# Patient Record
Sex: Male | Born: 1990 | Race: White | Hispanic: No | Marital: Married | Smoking: Never smoker
Health system: Southern US, Community
[De-identification: ages and names within clinical notes are randomized; demographics above are authoritative.]

---

## 2021-01-19 ENCOUNTER — Emergency Department: Payer: BC Managed Care – PPO

## 2021-01-19 ENCOUNTER — Encounter: Payer: Self-pay | Admitting: Emergency Medicine

## 2021-01-19 ENCOUNTER — Emergency Department
Admission: EM | Admit: 2021-01-19 | Discharge: 2021-01-19 | Disposition: A | Discharge status: Home / Self Care | Payer: BC Managed Care – PPO | Attending: Emergency Medicine | Admitting: Emergency Medicine

## 2021-01-19 ENCOUNTER — Ambulatory Visit
Admission: EM | Admit: 2021-01-19 | Discharge: 2021-01-19 | Disposition: A | Discharge status: Home / Self Care | Payer: BC Managed Care – PPO | Attending: Sports Medicine | Admitting: Sports Medicine

## 2021-01-19 ENCOUNTER — Other Ambulatory Visit: Payer: Self-pay

## 2021-01-19 DIAGNOSIS — Z20822 Contact with and (suspected) exposure to covid-19: Secondary | ICD-10-CM | POA: Diagnosis not present

## 2021-01-19 DIAGNOSIS — E0590 Thyrotoxicosis, unspecified without thyrotoxic crisis or storm: Secondary | ICD-10-CM

## 2021-01-19 DIAGNOSIS — R634 Abnormal weight loss: Secondary | ICD-10-CM

## 2021-01-19 DIAGNOSIS — I472 Ventricular tachycardia: Secondary | ICD-10-CM | POA: Diagnosis not present

## 2021-01-19 DIAGNOSIS — R000 Tachycardia, unspecified: Secondary | ICD-10-CM

## 2021-01-19 DIAGNOSIS — I499 Cardiac arrhythmia, unspecified: Secondary | ICD-10-CM

## 2021-01-19 DIAGNOSIS — R0602 Shortness of breath: Secondary | ICD-10-CM

## 2021-01-19 LAB — CBC
HCT: 40.7 % (ref 39.0–52.0)
Hemoglobin: 13.9 g/dL (ref 13.0–17.0)
MCH: 29 pg (ref 26.0–34.0)
MCHC: 34.2 g/dL (ref 30.0–36.0)
MCV: 84.8 fL (ref 80.0–100.0)
Platelets: 307 10*3/uL (ref 150–400)
RBC: 4.8 MIL/uL (ref 4.22–5.81)
RDW: 12.7 % (ref 11.5–15.5)
WBC: 4.5 10*3/uL (ref 4.0–10.5)
nRBC: 0 % (ref 0.0–0.2)

## 2021-01-19 LAB — BRAIN NATRIURETIC PEPTIDE: B Natriuretic Peptide: 11.2 pg/mL (ref 0.0–100.0)

## 2021-01-19 LAB — BASIC METABOLIC PANEL
Anion gap: 8 (ref 5–15)
BUN: 18 mg/dL (ref 6–20)
CO2: 27 mmol/L (ref 22–32)
Calcium: 9.7 mg/dL (ref 8.9–10.3)
Chloride: 102 mmol/L (ref 98–111)
Creatinine, Ser: 0.72 mg/dL (ref 0.61–1.24)
GFR, Estimated: >60 mL/min (ref >60)
Glucose, Bld: 110 mg/dL — ABNORMAL HIGH (ref 70–99)
Potassium: 4.3 mmol/L (ref 3.5–5.1)
Sodium: 137 mmol/L (ref 135–145)

## 2021-01-19 LAB — RESP PANEL BY RT-PCR (FLU A&B, COVID) ARPGX2
Influenza A by PCR: NEGATIVE
Influenza B by PCR: NEGATIVE
SARS Coronavirus 2 by RT PCR: NEGATIVE

## 2021-01-19 LAB — TSH: TSH: <0.01 u[IU]/mL — ABNORMAL LOW (ref 0.350–4.500)

## 2021-01-19 LAB — T4, FREE: Free T4: 4.76 ng/dL — ABNORMAL HIGH (ref 0.61–1.12)

## 2021-01-19 LAB — TROPONIN I (HIGH SENSITIVITY): Troponin I (High Sensitivity): 4 ng/L (ref <18)

## 2021-01-19 MED ORDER — PROPRANOLOL HCL 10 MG PO TABS: 10.0000 mg | ORAL_TABLET | Freq: Three times a day (TID) | ORAL | 0 refills | Status: AC

## 2021-01-19 MED ORDER — SODIUM CHLORIDE 0.9 % IV BOLUS
1000.0000 mL | Freq: Once | INTRAVENOUS | Status: AC
  Administered 2021-01-19: 1000 mL via INTRAVENOUS

## 2021-01-19 MED ORDER — IOHEXOL 350 MG/ML SOLN
75.0000 mL | Freq: Once | INTRAVENOUS | Status: AC | PRN
  Administered 2021-01-19: 75 mL via INTRAVENOUS

## 2021-01-19 NOTE — ED Triage Notes (Signed)
Patient c/o elevated heart rate that started about 1 week ago. He states over the last few days he has been having shortness of breath.

## 2021-01-19 NOTE — ED Triage Notes (Addendum)
Pt via EMS from [facility]. [facility] sent him over here for ST. Per note, pt HR in the 140. Pt also states he is having some SOB. Denies CP but does feel some palpitations in his chest and that's why he went to [facility]. Pt is A&Ox4 and NAD.

## 2021-01-19 NOTE — ED Triage Notes (Signed)
Pt comes into the ED via EMS from [facility] with c/o tachycardia for the past week up into the 140's with SOB  120-145HR 99%RA 128/65 98.3temp #[id number]

## 2021-01-19 NOTE — ED Provider Notes (Addendum)
[facility]    CSN: [id number] Arrival date & time: 01/19/21  1440      History   Chief Complaint Chief Complaint  Patient presents with  . Irregular Heart Beat  . Shortness of Breath    HPI Brett Huang is a 30 y.o. male.   Pleasant 30 year old male who presents for evaluation of racing heart and shortness of breath.  On further history it appears as though he has had a symptoms now for about 2 weeks.  He began while he was at home on the couch.  He was sitting watching a sporting event and his iWatch told him that his heart rate was greater than 120 bpm and he was not moving.  Started getting some intermittent episodes but is progressively worsened to the point now his heart rate is racing all the time.  His shortness of breath began about 2 days ago.  He stopped drinking caffeine about 48 hours ago.  He has been drinking plenty of water so there is no concern about dehydration.  He denies any fevers or recent infections.  Complicating situation is he has lost about 15 pounds in the last 4 months.  It was unintentional.  He does have some mild family history of thyroid disease but nothing personal.  He does not take any meds on a regular basis.  He denies any chest pain jaw pain diaphoresis or arm pain.  No history of anxiety.  Patient denies any stimulant use or any illicit drug use.     History reviewed. No pertinent past medical history.  There are no problems to display for this patient.   History reviewed. No pertinent surgical history.     Home Medications    Prior to Admission medications   Not on File    Family History Family History  Problem Relation Age of Onset  . Healthy Mother   . Healthy Father     Social History Social History   Tobacco Use  . Smoking status: Never Smoker  . Smokeless tobacco: Former Clinical biochemist  . Vaping Use: Never used  Substance Use Topics  . Alcohol use: Not Currently  . Drug use: Never      Allergies   Patient has no known allergies.   Review of Systems Review of Systems  Constitutional: Positive for activity change and chills. Negative for appetite change, diaphoresis, fatigue and fever.  HENT: Negative.  Negative for congestion, sinus pressure, sinus pain and sore throat.   Eyes: Negative.   Respiratory: Positive for chest tightness and shortness of breath. Negative for cough, wheezing and stridor.   Cardiovascular: Positive for palpitations. Negative for chest pain.  Gastrointestinal: Negative for abdominal distention, abdominal pain, constipation, diarrhea, nausea and vomiting.  Genitourinary: Negative for dysuria, flank pain and frequency.  Musculoskeletal: Negative.   Skin: Negative.   Neurological: Negative for dizziness, tremors, weakness, light-headedness, numbness and headaches.  All other systems reviewed and are negative.    Physical Exam Triage Vital Signs ED Triage Vitals  Enc Vitals Group     BP 01/19/21 1457 (!) 157/85     Pulse Rate 01/19/21 1457 (!) 146     Resp 01/19/21 1457 20     Temp 01/19/21 1457 98.3 F (36.8 C)     Temp Source 01/19/21 1457 Oral     SpO2 01/19/21 1457 99 %     Weight 01/19/21 1457 165 lb (74.8 kg)     Height 01/19/21 1457 5\' 9"  (1.753  m)     Head Circumference --      Peak Flow --      Pain Score 01/19/21 1456 0     Pain Loc --      Pain Edu? --      Excl. in GC? --    No data found.  Updated Vital Signs BP (!) 157/85 (BP Location: Right Arm)   Pulse (!) 146   Temp 98.3 F (36.8 C) (Oral)   Resp 20   Ht 5\' 9"  (1.753 m)   Wt 74.8 kg   SpO2 99%   BMI 24.37 kg/m   Visual Acuity Right Eye Distance:   Left Eye Distance:   Bilateral Distance:    Right Eye Near:   Left Eye Near:    Bilateral Near:     Physical Exam Vitals reviewed.  Constitutional:      General: He is in acute distress.     Appearance: He is well-developed. He is not ill-appearing or toxic-appearing.  HENT:     Head:  Normocephalic.     Mouth/Throat:     Mouth: Mucous membranes are moist.     Pharynx: Oropharynx is clear.  Eyes:     Extraocular Movements: Extraocular movements intact.     Pupils: Pupils are equal, round, and reactive to light.  Neck:     Thyroid: No thyromegaly.     Vascular: No hepatojugular reflux or JVD.     Trachea: No tracheal deviation.  Cardiovascular:     Rate and Rhythm: Regular rhythm. Tachycardia present.  No extrasystoles are present.    Chest Wall: PMI is not displaced.     Pulses: Normal pulses.          Carotid pulses are 2+ on the right side and 2+ on the left side.      Radial pulses are 2+ on the right side and 2+ on the left side.       Femoral pulses are 2+ on the right side and 2+ on the left side.      Popliteal pulses are 2+ on the right side and 2+ on the left side.       Dorsalis pedis pulses are 2+ on the right side and 2+ on the left side.       Posterior tibial pulses are 2+ on the right side and 2+ on the left side.     Heart sounds: Normal heart sounds. No murmur heard.  No systolic murmur is present.  No diastolic murmur is present. No friction rub. No gallop.   Pulmonary:     Effort: Tachypnea and accessory muscle usage present.     Breath sounds: No decreased breath sounds, wheezing, rhonchi or rales.  Abdominal:     Palpations: Abdomen is soft.  Musculoskeletal:     Cervical back: Normal range of motion and neck supple.     Right lower leg: No edema.     Left lower leg: No edema.  Lymphadenopathy:     Cervical: No cervical adenopathy.  Skin:    General: Skin is warm and dry.     Capillary Refill: Capillary refill takes less than 2 seconds.  Neurological:     General: No focal deficit present.     Mental Status: He is alert and oriented to person, place, and time.      UC Treatments / Results  Labs (all labs ordered are listed, but only abnormal results are displayed) Labs Reviewed - No data to  display  EKG  Twelve-lead EKG was  performed on January 19, 2021 at 3 PM.  Showed a ventricular rate of 135 bpm.  PR interval of 130 ms.  QRS duration 86 ms.  QT and corrected QT was 290 and 435 ms.  EKG shows no acute ST or T wave changes.  There is sinus tachycardia appreciated.  Radiology No results found.  Procedures Procedures (including critical care time)  Medications Ordered in UC Medications - No data to display  Initial Impression / Assessment and Plan / UC Course  I have reviewed the triage vital signs and the nursing notes.  Pertinent labs & imaging results that were available during my care of the patient were reviewed by me and considered in my medical decision making (see chart for details).  Clinical impression: Healthy 30 year old male presents with 2 weeks of palpitations and racing heartbeat with 48 hours of shortness of breath.  No evidence of infection or hypovolemia.  Differential diagnosis:  ?Fever - none ?Volume depletion - reports good volume intake ?Hypotension and shock - not present on exam ?Sepsis - non toxic with fever ?Anemia - denies blood loss - labs not done as sending to ER ?Hypoxia - 99% on RA ?Pulmonary embolism - less likely - no recent travel and very active - BP is elevated appropriately ?Acute coronary ischemia and myocardial infarction - ER to assess ?Pain - denies ?Anxiety - denies ?Sleep deprivation - denies ?Pheochromocytoma - unknown - possible ?Hyperthyroidism - unknown - very possible given history and recent unintentional weigh loss ?Decompensated heart failure - no ?Chronic pulmonary disease - no  ?Exposure to stimulants (nicotine, caffeine, amphetamines), anticholinergic drugs, beta blocker withdrawal, or illicit drugs - denies ?Abrupt withdrawal of medications such as beta blockers - denies   Treatment plan: 1.  The findings and treatment plan were discussed in detail with the patient.  Patient was in agreement. 2.  The differential diagnosis is quite long  for his presentation.  His initial pulse rate was 146.  EKG showed sinus tachycardia with a rate of 135.  No acute ST or T wave changes. 3.  Given his current situation I felt it best to get him to a higher level of care and will transport him via EMS to the emergency room.  No labs were drawn prior to his departure.  I suspect he has hyperthyroidism or a pheochromocytoma.  Concern for thyroid storm and will transfer him to the emergency room. 4.  Transfer care to [facility] at this time.    Final Clinical Impressions(s) / UC Diagnoses   Final diagnoses:  Paroxysmal ventricular tachycardia (HCC)  SOB (shortness of breath)  Loss of weight     Discharge Instructions     Transfer to the emergency room via EMS for further evaluation of tachycardia into the 140s with associated tachypnea.    ED Prescriptions    None     PDMP not reviewed this encounter.   Delton See, MD 01/19/21 Gayla Medicus    Delton See, MD 01/21/21 [phone]

## 2021-01-19 NOTE — ED Provider Notes (Signed)
[facility] Emergency Department Provider Note  ____________________________________________   Event Date/Time   First MD Initiated Contact with Patient 01/19/21 1721     (approximate)  I have reviewed the triage vital signs and the nursing notes.   HISTORY  Chief Complaint Tachycardia    HPI Brett Huang is a 30 y.o. male who comes in for elevated heart rate.  Patient states over the past 2 weeks he has noticed that his heart rate has been more elevated.  He states that he has a monitor on his watch has been getting alerts.  Denies ever having this previously.  He has been constant, nothing makes it better, nothing makes it worse.  Does report 15 pound weight loss has been unintentional.  States that he just feels palpitations.  No chest pain.  Does have some shortness of breath associated with it.          History reviewed. No pertinent past medical history.  There are no problems to display for this patient.   History reviewed. No pertinent surgical history.  Prior to Admission medications   Not on File    Allergies Patient has no known allergies.  Family History  Problem Relation Age of Onset  . Healthy Mother   . Healthy Father     Social History Social History   Tobacco Use  . Smoking status: Never Smoker  . Smokeless tobacco: Former Clinical biochemist  . Vaping Use: Never used  Substance Use Topics  . Alcohol use: Not Currently  . Drug use: Never      Review of Systems Constitutional: No fever/chills Eyes: No visual changes. ENT: No sore throat. Cardiovascular: Positive chest pain, palpitation Respiratory: Positive shortness of breath Gastrointestinal: No abdominal pain.  No nausea, no vomiting.  No diarrhea.  No constipation. Genitourinary: Negative for dysuria. Musculoskeletal: Negative for back pain. Skin: Negative for rash. Neurological: Negative for headaches, focal weakness or numbness. All other ROS  negative ____________________________________________   PHYSICAL EXAM:  VITAL SIGNS: ED Triage Vitals  Enc Vitals Group     BP 01/19/21 1635 123/71     Pulse Rate 01/19/21 1635 (!) 131     Resp 01/19/21 1635 20     Temp 01/19/21 1635 98.1 F (36.7 C)     Temp Source 01/19/21 1635 Oral     SpO2 01/19/21 1635 100 %     Weight 01/19/21 1632 165 lb (74.8 kg)     Height 01/19/21 1632 5\' 9"  (1.753 m)     Head Circumference --      Peak Flow --      Pain Score 01/19/21 1632 0     Pain Loc --      Pain Edu? --      Excl. in GC? --     Constitutional: Alert and oriented. Well appearing and in no acute distress. Eyes: Conjunctivae are normal. EOMI. Head: Atraumatic. Nose: No congestion/rhinnorhea. Mouth/Throat: Mucous membranes are moist.   Neck: No stridor. Trachea Midline. FROM Cardiovascular: Tachycardic, regular rhythm. Grossly normal heart sounds.  Good peripheral circulation. Respiratory: Normal respiratory effort.  No retractions. Lungs CTAB. Gastrointestinal: Soft and nontender. No distention. No abdominal bruits.  Musculoskeletal: No lower extremity tenderness nor edema.  No joint effusions. Neurologic:  Normal speech and language. No gross focal neurologic deficits are appreciated.  Skin:  Skin is warm, dry and intact. No rash noted. Psychiatric: Mood and affect are normal. Speech and behavior are normal. GU: Deferred  ____________________________________________   LABS (all labs ordered are listed, but only abnormal results are displayed)  Labs Reviewed  BASIC METABOLIC PANEL - Abnormal; Notable for the following components:      Result Value   Glucose, Bld 110 (*)    All other components within normal limits  CBC  TROPONIN I (HIGH SENSITIVITY)   ____________________________________________   ED ECG REPORT I, Concha Se, the attending physician, personally viewed and interpreted this ECG.  Sinus tachycardia rate of 126, no ST elevation, no T wave  inversions, normal intervals ____________________________________________  RADIOLOGY Vela Prose, personally viewed and evaluated these images (plain radiographs) as part of my medical decision making, as well as reviewing the written report by the radiologist.  ED MD interpretation: No pneumonia  Official radiology report(s): DG Chest 2 View  Result Date: 01/19/2021 CLINICAL DATA:  Chest pain.  Elevated heart rate for about a week. EXAM: CHEST - 2 VIEW COMPARISON:  None. FINDINGS: The heart size and mediastinal contours are within normal limits. No focal consolidation. No pulmonary edema. No pleural effusion. No pneumothorax. No acute osseous abnormality. IMPRESSION: No active cardiopulmonary disease. Electronically Signed   By: Tish Frederickson M.D.   On: 01/19/2021 16:55    ____________________________________________   PROCEDURES  Procedure(s) performed (including Critical Care):  .1-3 Lead EKG Interpretation Performed by: Concha Se, MD Authorized by: Concha Se, MD     Interpretation: abnormal     ECG rate:  130s    ECG rate assessment: tachycardic     Rhythm: sinus tachycardia     Ectopy: none     Conduction: normal       ____________________________________________   INITIAL IMPRESSION / ASSESSMENT AND PLAN / ED COURSE  Brett Huang was evaluated in Emergency Department on 01/19/2021 for the symptoms described in the history of present illness. He was evaluated in the context of the global COVID-19 pandemic, which necessitated consideration that the patient might be at risk for infection with the SARS-CoV-2 virus that causes COVID-19. Institutional protocols and algorithms that pertain to the evaluation of patients at risk for COVID-19 are in a state of rapid change based on information released by regulatory bodies including the CDC and federal and state organizations. These policies and algorithms were followed during the patient's care in the ED.    Patient  presents with tachycardia, weight loss, palpitations, shortness of breath.  Labs to evaluate for anemia, AKI, dehydration, myocarditis, ACS, CT evaluate for PE   Cardiac marker was negative making ACS or myocarditis less likely. No evidence of anemia, no white count elevation to suggest infection PE test negative  TSH is undetectable and T4 is 4.76  Patient is hyperthyroidism.  Patient has a PCP appointment next week.  Also provided patient with 2 different endocrine numbers to see if we get in with soon.  This time do not think patient is in thyroid storm.  His heart rates already come down to 112 with fluids and he has no altered mental status or fever and is otherwise very well-appearing is been going on for a few weeks.  Patient feels comfortable following up outpatient.  We will start him on a low-dose of propranolol that I discussed with pharmacy about doing 10 mg 3 times a day to help with the symptoms.  Explained to patient that he need to be seen by a primary doctor with endocrine doctor to be started on the specific medication for the thyroid however given needs close  follow-up.  Patient stressed understanding and will follow up promptly.  Patient will avoid caffeine until the      ____________________________________________   FINAL CLINICAL IMPRESSION(S) / ED DIAGNOSES   Final diagnoses:  Hyperthyroidism      MEDICATIONS GIVEN DURING THIS VISIT:  Medications  sodium chloride 0.9 % bolus 1,000 mL (0 mLs Intravenous Stopped 01/19/21 1733)  iohexol (OMNIPAQUE) 350 MG/ML injection 75 mL (75 mLs Intravenous Contrast Given 01/19/21 1750)     ED Discharge Orders         Ordered    propranolol (INDERAL) 10 MG tablet  3 times daily        01/19/21 1859           Note:  This document was prepared using Dragon voice recognition software and may include unintentional dictation errors.   Concha Se, MD 01/19/21 [phone]

## 2021-01-19 NOTE — ED Notes (Signed)
Repeat troponin cancelled per MD

## 2021-01-19 NOTE — Discharge Instructions (Addendum)
TSH is undetectable and T4 is 4.76 Start the propranolol to help with your heart rate. Call the above numbers and state that you were diagnosed with hyperthyroidism in the emergency room and you need to get follow-up for your elevated heart rate and to be started on thyroid medications.  I given you 2 different numbers to call.  Only need to make an appointment with whoever you get seen with the fastest.  Return to the ER for fevers, worsening heart rate, confusion or any other concerns

## 2021-01-19 NOTE — ED Notes (Signed)
Patient is being discharged from the Urgent Care and sent to the Emergency Department via EMS . Per Dr. Zachery Dauer, patient is in need of higher level of care due to tachycardia. Patient is aware and verbalizes understanding of plan of care.  Vitals:   01/19/21 1457  BP: (!) 157/85  Pulse: (!) 146  Resp: 20  Temp: 98.3 F (36.8 C)  SpO2: 99%

## 2021-01-19 NOTE — ED Notes (Signed)
Patient is being discharged from the Urgent Care and sent to the Emergency Department via EMS. Per Dr. Delton See, patient is in need of higher level of care due to tachycardia and needing a higher level of care. Patient is aware and verbalizes understanding of plan of care.  Vitals:   01/19/21 1457  BP: (!) 157/85  Pulse: (!) 146  Resp: 20  Temp: 98.3 F (36.8 C)  SpO2: 99%

## 2021-01-19 NOTE — Discharge Instructions (Addendum)
Transfer to the emergency room via EMS for further evaluation of tachycardia into the 140s with associated tachypnea.

## 2021-11-09 ENCOUNTER — Other Ambulatory Visit: Payer: Self-pay | Admitting: Internal Medicine

## 2021-11-09 DIAGNOSIS — E0590 Thyrotoxicosis, unspecified without thyrotoxic crisis or storm: Secondary | ICD-10-CM

## 2021-11-24 ENCOUNTER — Other Ambulatory Visit: Payer: Self-pay

## 2021-11-24 ENCOUNTER — Encounter
Admission: RE | Admit: 2021-11-24 | Discharge: 2021-11-24 | Disposition: A | Discharge status: Home / Self Care | Payer: BC Managed Care – PPO | Source: Ambulatory Visit | Attending: Internal Medicine | Admitting: Internal Medicine

## 2021-11-24 DIAGNOSIS — E0590 Thyrotoxicosis, unspecified without thyrotoxic crisis or storm: Secondary | ICD-10-CM | POA: Insufficient documentation

## 2021-11-30 ENCOUNTER — Other Ambulatory Visit: Payer: Self-pay | Admitting: Internal Medicine

## 2021-11-30 DIAGNOSIS — E0590 Thyrotoxicosis, unspecified without thyrotoxic crisis or storm: Secondary | ICD-10-CM

## 2021-12-01 ENCOUNTER — Encounter
Admission: RE | Admit: 2021-12-01 | Discharge: 2021-12-01 | Disposition: A | Discharge status: Home / Self Care | Payer: BC Managed Care – PPO | Source: Ambulatory Visit | Attending: Internal Medicine | Admitting: Internal Medicine

## 2021-12-01 DIAGNOSIS — E0590 Thyrotoxicosis, unspecified without thyrotoxic crisis or storm: Secondary | ICD-10-CM | POA: Diagnosis present

## 2021-12-01 MED ORDER — SODIUM IODIDE I-123 7.4 MBQ CAPS
282.7000 | ORAL_CAPSULE | Freq: Once | ORAL | Status: AC
  Administered 2021-12-01: 282.7 via ORAL

## 2021-12-02 ENCOUNTER — Encounter
Admission: RE | Admit: 2021-12-02 | Discharge: 2021-12-02 | Disposition: A | Discharge status: Home / Self Care | Payer: BC Managed Care – PPO | Source: Ambulatory Visit | Attending: Internal Medicine | Admitting: Internal Medicine

## 2022-01-11 ENCOUNTER — Other Ambulatory Visit: Payer: Self-pay | Admitting: Internal Medicine

## 2022-01-12 ENCOUNTER — Other Ambulatory Visit: Payer: Self-pay | Admitting: Internal Medicine

## 2022-01-14 ENCOUNTER — Other Ambulatory Visit: Payer: Self-pay | Admitting: Internal Medicine

## 2022-01-15 ENCOUNTER — Other Ambulatory Visit: Payer: Self-pay | Admitting: Internal Medicine

## 2022-01-15 DIAGNOSIS — E0590 Thyrotoxicosis, unspecified without thyrotoxic crisis or storm: Secondary | ICD-10-CM

## 2022-01-15 NOTE — Written Directive (Addendum)
MOLECULAR IMAGING AND THERAPEUTICS WRITTEN DIRECTIVE ? ? ?PATIENT NAME: Brett Huang ? ?PT DOB:   1991/09/12 ?                                             ?MRN: [id number] ? ?--------------------------------------------------------------------------------------------------------------------- ? ? ?I-131 WHOLE THYROID THERAPY (NON-CANCER) ? ? ? ?RADIOPHARMACEUTICAL:   Iodine-131 Capsule  ? ? ?PRESCRIBED DOSE FOR ADMINISTRATION:  17 mCi ? ? ?ROUTE OFADMINISTRATION: PO ? ? ?DIAGNOSIS:   ?Graves disease  ? ?REFERRING PHYSICIAN: [facility]  ? ? ?TSH:    ?Lab Results  ?Component Value Date  ? TSH <0.010 (L) 01/19/2021  ? ? ? ?PRIOR I-131 THERAPY (Date and Dose): ? ? ?PRIOR RADIOLOGY EXAMS (Results and Date): ?NM THYROID MULT UPTAKE W/IMAGING ? ?Result Date: 12/02/2021 ?CLINICAL DATA:  Hyperthyroidism without crisis EXAM: THYROID SCAN AND UPTAKE - 4 AND 24 HOURS TECHNIQUE: Following oral administration of I-123 capsule, anterior planar imaging was acquired at 24 hours. Thyroid uptake was calculated with a thyroid probe at 4-6 hours and 24 hours. RADIOPHARMACEUTICALS:  282.7 uCi I-123 sodium iodide p.o. COMPARISON:  None FINDINGS: Diffuse tracer uptake throughout both lobes and pyramidal lobe. No focal areas of increased or decreased tracer localization seen. 4 hour I-123 uptake = 40.1% (normal 5-20%) 24 hour I-123 uptake = 57.9% (normal 10-30%) IMPRESSION: Elevated 4 hour and 24 hour radio iodine uptakes in combination with imaging findings consistent with Graves disease. Electronically Signed   By: Ulyses Southward M.D.   On: 12/02/2021 10:35    ? ? ?ADDITIONAL PHYSICIAN COMMENTS/NOTES ? ?Imaging finding consistent with Graves disease. TSH depressed.  Trial of methemazole.  Heart racing:  currently on beta-blocker  ?AUTHORIZED USER SIGNATURE & TIME STAMP: ?Patriciaann Clan, MD   01/15/22    1:25 PM   ?

## 2022-01-22 ENCOUNTER — Other Ambulatory Visit: Payer: Self-pay

## 2022-01-22 ENCOUNTER — Encounter
Admission: RE | Admit: 2022-01-22 | Discharge: 2022-01-22 | Disposition: A | Discharge status: Home / Self Care | Payer: BC Managed Care – PPO | Source: Ambulatory Visit | Attending: Internal Medicine | Admitting: Internal Medicine

## 2022-01-22 DIAGNOSIS — E0590 Thyrotoxicosis, unspecified without thyrotoxic crisis or storm: Secondary | ICD-10-CM | POA: Insufficient documentation

## 2022-01-22 MED ORDER — SODIUM IODIDE I 131 CAPSULE
17.4000 | Freq: Once | INTRAVENOUS | Status: AC | PRN
Start: 2022-01-22 — End: 2022-01-22
  Administered 2022-01-22: 17.4 via ORAL

## 2023-10-23 IMAGING — NM NM THYROID IMAGING W/ UPTAKE MULTI (4&24 HR)
1 series · 4 of 4 positions shown · non-contrast
Comparison: None

CLINICAL DATA: Hyperthyroidism without crisis

EXAM:
THYROID SCAN AND UPTAKE - 4 AND 24 HOURS
TECHNIQUE: Following oral administration of Q-FDR capsule, anterior planar
imaging was acquired at 24 hours. Thyroid uptake was calculated with
a thyroid probe at 4-6 hours and 24 hours.
RADIOPHARMACEUTICALS:  282.7 uCi Q-FDR sodium iodide p.o.

[Series 1000: (id) thyroid scan · 2.40mm/px · 4 of 4 slices shown]
[im 1/4]
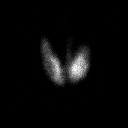
[im 2/4]
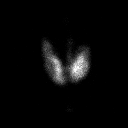
[im 3/4]
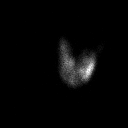
[im 4/4]
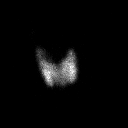

[4 of 4 positions shown; findings below may reference images not displayed]

FINDINGS: Diffuse tracer uptake throughout both lobes and pyramidal lobe.

No focal areas of increased or decreased tracer localization seen.

4 hour Q-FDR uptake = 40.1% (normal 5-20%)

24 hour Q-FDR uptake = 57.9% (normal 10-30%)
IMPRESSION: Elevated 4 hour and 24 hour radio iodine uptakes in combination with
imaging findings consistent with Graves disease.
